# Patient Record
Sex: Female | Born: 1950 | Race: Asian | Hispanic: Yes | Marital: Married | Smoking: Former smoker
Health system: Southern US, Community
[De-identification: ages and names within clinical notes are randomized; demographics above are authoritative.]

## PROBLEM LIST (undated history)

## (undated) DIAGNOSIS — I272 Pulmonary hypertension, unspecified: Secondary | ICD-10-CM

## (undated) DIAGNOSIS — E1122 Type 2 diabetes mellitus with diabetic chronic kidney disease: Secondary | ICD-10-CM

## (undated) DIAGNOSIS — G4730 Sleep apnea, unspecified: Secondary | ICD-10-CM

## (undated) DIAGNOSIS — H43811 Vitreous degeneration, right eye: Secondary | ICD-10-CM

## (undated) DIAGNOSIS — E7800 Pure hypercholesterolemia, unspecified: Secondary | ICD-10-CM

## (undated) DIAGNOSIS — I10 Essential (primary) hypertension: Secondary | ICD-10-CM

## (undated) DIAGNOSIS — Z8719 Personal history of other diseases of the digestive system: Secondary | ICD-10-CM

## (undated) DIAGNOSIS — D649 Anemia, unspecified: Secondary | ICD-10-CM

## (undated) DIAGNOSIS — R011 Cardiac murmur, unspecified: Secondary | ICD-10-CM

## (undated) DIAGNOSIS — H04123 Dry eye syndrome of bilateral lacrimal glands: Secondary | ICD-10-CM

## (undated) DIAGNOSIS — E785 Hyperlipidemia, unspecified: Secondary | ICD-10-CM

## (undated) DIAGNOSIS — I480 Paroxysmal atrial fibrillation: Secondary | ICD-10-CM

## (undated) DIAGNOSIS — E039 Hypothyroidism, unspecified: Secondary | ICD-10-CM

## (undated) DIAGNOSIS — K219 Gastro-esophageal reflux disease without esophagitis: Secondary | ICD-10-CM

## (undated) HISTORY — DX: Essential (primary) hypertension: I10

## (undated) HISTORY — DX: Gastro-esophageal reflux disease without esophagitis: K21.9

## (undated) HISTORY — DX: Vitreous degeneration, right eye: H43.811

## (undated) HISTORY — DX: Pure hypercholesterolemia, unspecified: E78.00

## (undated) HISTORY — PX: BREAST REDUCTION SURGERY: SHX8

## (undated) HISTORY — PX: TONSILLECTOMY: SUR1361

## (undated) HISTORY — PX: EYE SURGERY: SHX253

## (undated) HISTORY — DX: Personal history of other diseases of the digestive system: Z87.19

## (undated) HISTORY — DX: Hypothyroidism, unspecified: E03.9

## (undated) HISTORY — DX: Sleep apnea, unspecified: G47.30

## (undated) HISTORY — DX: Anemia, unspecified: D64.9

## (undated) HISTORY — DX: Cardiac murmur, unspecified: R01.1

## (undated) HISTORY — DX: Paroxysmal atrial fibrillation: I48.0

## (undated) HISTORY — DX: Dry eye syndrome of bilateral lacrimal glands: H04.123

## (undated) HISTORY — DX: Hyperlipidemia, unspecified: E78.5

## (undated) HISTORY — PX: CHOLECYSTECTOMY: SHX55

## (undated) HISTORY — DX: Pulmonary hypertension, unspecified: I27.20

## (undated) HISTORY — DX: Type 2 diabetes mellitus with diabetic chronic kidney disease: E11.22

---

## 2009-08-26 HISTORY — PX: NEPHRECTOMY: SHX65

## 2015-08-27 HISTORY — PX: CARDIAC CATHETERIZATION: SHX172

## 2017-12-16 ENCOUNTER — Ambulatory Visit: Payer: Medicare Other | Admitting: Internal Medicine

## 2017-12-16 ENCOUNTER — Encounter: Payer: Self-pay | Admitting: Internal Medicine

## 2017-12-16 ENCOUNTER — Ambulatory Visit (INDEPENDENT_AMBULATORY_CARE_PROVIDER_SITE_OTHER)
Admission: RE | Admit: 2017-12-16 | Discharge: 2017-12-16 | Disposition: A | Discharge status: Home / Self Care | Payer: Medicare Other | Source: Ambulatory Visit | Attending: Internal Medicine | Admitting: Internal Medicine

## 2017-12-16 VITALS — BP 120/70 | HR 74 | Ht 61.0 in | Wt 169.6 lb

## 2017-12-16 DIAGNOSIS — R0609 Other forms of dyspnea: Secondary | ICD-10-CM

## 2017-12-16 MED ORDER — PREDNISONE 10 MG PO TABS: ORAL_TABLET | ORAL | 0 refills | Status: DC

## 2017-12-16 MED ORDER — BUDESONIDE-FORMOTEROL FUMARATE 160-4.5 MCG/ACT IN AERO: 2.0000 | INHALATION_SPRAY | Freq: Two times a day (BID) | RESPIRATORY_TRACT | 0 refills | Status: DC

## 2017-12-16 NOTE — Assessment & Plan Note (Addendum)
12/16/2017  Walked RA x 3 laps @ 185 ft each stopped due to  End of study, nl pace, no   desat  At nl pace, some sob  - Spirometry 12/16/2017  FEV1 1.30 (73%)  Ratio 78 min curvature p ? With very poor hfa - 12/16/2017  After extensive coaching inhaler device  effectiveness =    75% from a baseline of 25%    Symptoms are markedly disproportionate to objective findings and not clear to what extent this is actually a pulmonary  problem but pt does appear to have difficult to sort out respiratory symptoms of unknown origin for which  DDX  = almost all start with A and  include Adherence, Ace Inhibitors, Acid Reflux, Active Sinus Disease, Alpha 1 Antitripsin deficiency, Anxiety masquerading as Airways dz,  ABPA,  Allergy(esp in young), Aspiration (esp in elderly), Adverse effects of meds,  Active smokers, A bunch of PE's/clot burden (a few small clots can't cause this syndrome unless there is already severe underlying pulm or vascular dz with poor reserve),  Anemia or thyroid disorder, plus two Bs  = Bronchiectasis and Beta blocker use..and one C= CHF     Adherence is always the initial "prime suspect" and is a multilayered concern that requires a "trust but verify" approach in every patient - starting with knowing how to use medications, especially inhalers, correctly, keeping up with refills and understanding the fundamental difference between maintenance and prns vs those medications only taken for a very short course and then stopped and not refilled.  - see hfa teaching - return with all meds in hand using a trust but verify approach to confirm accurate Medication  Reconciliation The principal here is that until we are certain that the  patients are doing what we've asked, it makes no sense to ask them to do more.    ? Asthma/ allergy > suggested by resp to prednisone reported > repeat pred and start symb 160 2bid x 2 weeks trial then return to regroup   ? Adverse drug affect > off amiodarone x 2 weeks s  evidence of ILD   ? Anxiety/ depression/ deconditioning > dx of exclusion   ? Anemia /thyroid dz > has high tsh per care everywhere and this might contribute to fatigue but not variable doe   ? BB effects > unlikely at such low levels  ? CHF > def cardiomegaly s/p avr > last echo 02/2017 not accessible in care everywhere.> need bnp on return    Total time devoted to counseling  > 50 % of initial 60 min office visit:  review case with pt/ discussion of options/alternatives/ personally creating written customized instructions  in presence of pt  then going over those specific  Instructions directly with the pt including how to use all of the meds but in particular covering each new medication in detail and the difference between the maintenance= "automatic" meds and the prns using an action plan format for the latter (If this problem/symptom => do that organization reading Left to right).  Please see AVS from this visit for a full list of these instructions which I personally wrote for this pt and  are unique to this visit.

## 2017-12-16 NOTE — Progress Notes (Signed)
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   12/16/2017 1st [facility] office visit/ Amber Costa   Chief Complaint  Patient presents with  . Pulmonary Consult    Self referral. Pt c/o cough, congestion and SOB for the past year.  Cough is non prod. She gets SOB walking less than 30 min and also with walking up stairs. She states that when she walks she breaks out in a rash on her face and chest.   variable doe since AVR in 2017 off amiodarone x 2 weeks prior to OV  No better  Prednisone helped a lot/ inhalers helped but worried about her heart and not consistent with them and doesn't know their names nor is she able to use hfa (see a/p)  Cough is  Dry and more day than noct  Doe = MMRC1 = can walk nl pace, flat grade, can't hurry or go uphills or steps s sob     No obviouspatterms day to day or daytime variability or assoc excess/ purulent sputum or mucus plugs or hemoptysis or cp or chest tightness, subjective wheeze or overt sinus or hb symptoms. No unusual exposure hx or h/o childhood pna/ asthma or knowledge of premature birth.  Sleepings on 2 big pillows   without nocturnal  or early am exacerbation  of respiratory  c/o's or need for noct saba. Also denies any obvious fluctuation of symptoms with weather or environmental changes or other aggravating or alleviating factors except as outlined above   Current Allergies, Complete Past Medical History, Past Surgical History, Family History, and  Social History were reviewed in [facility] electronic medical record.  ROS  The following are not active complaints unless bolded Hoarseness, sore throat, dysphagia, dental problems, itching, sneezing,  nasal congestion or discharge of excess mucus or purulent secretions, ear ache,   fever, chills, sweats, unintended wt loss or wt gain, classically pleuritic or exertional cp,  orthopnea pnd or arm/hand swelling  or leg swelling, presyncope, palpitations, abdominal pain, anorexia, nausea, vomiting, diarrhea  or change in bowel habits or change in bladder habits, change in stools or change in urine, dysuria, hematuria,  rash, arthralgias, visual complaints, headache, numbness, weakness or ataxia or problems with walking or coordination,  change in mood or  memory.        Current Meds  Medication Sig  . albuterol (PROVENTIL HFA) 108 (90 Base) MCG/ACT inhaler Inhale 2 puffs into the lungs every 4 (four) hours as needed.  Marland Kitchen. aspirin 81 MG chewable tablet Chew 1 tablet by mouth daily.  Marland Kitchen. b complex vitamins tablet Take 1 tablet by mouth daily.  . cholecalciferol (VITAMIN D) 1000 units tablet Take 1,000 Units by mouth daily.  Marland Kitchen. levothyroxine (SYNTHROID, LEVOTHROID) 112 MCG tablet Take 112 mcg by mouth daily before breakfast.  . losartan (COZAAR) 25 MG tablet Take 1 tablet by mouth daily.  . metoprolol tartrate (LOPRESSOR) 25 MG  tablet Take 0.5 tablets by mouth 2 (two) times daily.  . Multiple Vitamins-Minerals (HAIR SKIN NAILS PO) Take by mouth daily.         Review of Systems     Objective:   Physical Exam amb elderly latina nad   Wt Readings from Last 3 Encounters:  12/16/17 169 lb 9.6 oz (76.9 kg)     Vital signs reviewed - Note on arrival 02 sats  100% on RA        HEENT: nl dentition, turbinates bilaterally, and oropharynx. Nl external ear canals without cough reflex   NECK :  without JVD/Nodes/TM/ nl carotid upstrokes bilaterally   LUNGS: no acc muscle use,  Nl contour  chest with late exp wheeze bilaterally with  cough on  exp maneuvers   CV:  RRR  no s3 or murmur or increase in P2, and no edema   ABD:  soft and nontender with nl inspiratory excursion in the supine position. No bruits or organomegaly appreciated, bowel sounds nl  MS:  Nl gait/ ext warm without deformities, calf tenderness, cyanosis or clubbing No obvious joint restrictions   SKIN: warm and dry without lesions    NEURO:  alert, approp, nl sensorium with  no motor or cerebellar deficits apparent.     CXR PA and Lateral:   12/16/2017 :    I personally reviewed images and   impression as follows:   CM s chf/ moderate thoracic kyphosis          Assessment:

## 2017-12-16 NOTE — Patient Instructions (Addendum)
Try symbicort 160 Take 2 puffs first thing in am and then another 2 puffs about 12 hours later  Work on inhaler technique:  relax and gently blow all the way out then take a nice smooth deep breath back in, triggering the inhaler at same time you start breathing in.  Hold for up to 5 seconds if you can. Blow out thru nose. Rinse and gargle with water when done      Prednisone 10 mg take  4 each am x 2 days,   2 each am x 2 days,  1 each am x 2 days and stop    Please remember to go to the  x-ray department downstairs in the basement  for your tests - we will call you with the results when they are available.      Please schedule a follow up office visit in 2  weeks, sooner if needed  with all medications /inhalers/ solutions in hand so we can verify exactly what you are taking. This includes all medications from all doctors and over the counters

## 2017-12-17 ENCOUNTER — Encounter: Payer: Self-pay | Admitting: Internal Medicine

## 2017-12-17 ENCOUNTER — Telehealth: Payer: Self-pay | Admitting: Internal Medicine

## 2017-12-17 NOTE — Progress Notes (Signed)
LMTCB

## 2017-12-17 NOTE — Telephone Encounter (Signed)
Notes recorded by Nyoka CowdenWert, Michael B, MD on 12/17/2017 at 11:11 AM EDT Call pt: Reviewed cxr and no acute change so [facility]  Advised pt of results. Pt understood and nothing further is needed.

## 2018-01-02 ENCOUNTER — Encounter: Payer: Self-pay | Admitting: Internal Medicine

## 2018-01-02 ENCOUNTER — Ambulatory Visit: Payer: Medicare Other | Admitting: Internal Medicine

## 2018-01-02 VITALS — BP 128/80 | HR 92 | Ht 61.0 in | Wt 168.8 lb

## 2018-01-02 DIAGNOSIS — R0609 Other forms of dyspnea: Secondary | ICD-10-CM | POA: Diagnosis not present

## 2018-01-02 NOTE — Patient Instructions (Signed)
Symbicort 160 can be used up to 2 puffs every 12 hours if any trouble with breathing/ coughing/ wheezing/ chest tight but no need to use if doing well   Only use your albuterol as a rescue medication to be used if you can't catch your breath by resting or doing a relaxed purse lip breathing pattern.  - The less you use it, the better it will work when you need it. - Ok to use up to 2 puffs  every 4 hours if you must but call for immediate appointment if use goes up over your usual need - Don't leave home without it !!  (think of it like the spare tire for your car)     Let your primary doctor decide about whether you need to return to see [place] but if you do need to return, bring back all your active medications with you

## 2018-01-02 NOTE — Progress Notes (Addendum)
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    History of Present Illness  12/16/2017 1st [facility] office [facility]/ Amber Costa   Chief Complaint  Patient presents with  . Pulmonary Consult    Self referral. Pt c/o cough, congestion and SOB for the past year.  Cough is non prod. She gets SOB walking less than 30 min and also with walking up stairs. She states that when she walks she breaks out in a rash on her face and chest.   variable doe since AVR in 2017 off amiodarone x 2 weeks prior to OV  No better  Prednisone helped a lot/ inhalers helped but worried about her heart and not consistent with them and doesn't know their names nor is she able to use hfa (see a/p)  Cough is  Dry and more day than noct  Doe = MMRC1 = can walk nl pace, flat grade, can't hurry or go uphills or steps s sob   rec Try symbicort 160 Take 2 puffs first thing in am and then another 2 puffs about 12 hours later Work on inhaler technique:   Prednisone 10 mg take  4 each am x 2 days,   2 each am x 2 days,  1 each am x 2 days and stop    01/02/2018  f/u ov/Amber Costa re: unexplained sob s/p amiodarone rx around 11/30/17 - did not bring back meds as req  Chief Complaint  Patient presents with  . Follow-up    Breathing has improved some. She c/o "allergies"- cough with clear sputum.  She has not had to use her albuterol inhaler. She states she has a rash on her face that comes and goes- with exercise and  stress.   Dyspnea: improved  MMRC1 = can walk nl pace, flat grade, can't hurry or go uphills or steps s sob   Cough: minimal daytime/ not noct or early / mucoid Did not really feel prednisone made that much difference with breathing or impact on itchy rash which comes and goes s pattern chronically ? Hives    SABA use:  None since last ov   No obvious day to day or daytime variability or assoc excess/ purulent sputum or mucus plugs or hemoptysis or cp or chest tightness, subjective wheeze or overt sinus or hb symptoms. No unusual exposure hx or h/o childhood pna/ asthma or knowledge of premature birth.  Sleeping  With min head elevation   without nocturnal  or early am exacerbation  of respiratory  c/o's or need for noct saba. Also denies any obvious fluctuation of symptoms with weather or environmental changes or other aggravating or alleviating factors except as outlined above   Current Allergies, Complete Past Medical History, Past Surgical History, Family History, and Social History were reviewed in [facility] record.  ROS  The following are not active complaints unless bolded  Hoarseness, sore throat, dysphagia, dental problems, itching, sneezing,  nasal congestion or discharge of excess mucus or purulent secretions, ear ache,   fever, chills, sweats, unintended wt loss or wt gain, classically pleuritic or exertional cp,  orthopnea pnd or arm/hand swelling  or leg swelling, presyncope, palpitations, abdominal pain, anorexia, nausea, vomiting, diarrhea  or change in bowel habits or change in bladder habits, change in stools or change in urine, dysuria, hematuria,  Rash chronic recurrent , arthralgias, visual complaints, headache, numbness, weakness or ataxia or problems with walking or coordination,  change in mood or  memory.        Current Meds  Medication Sig  . albuterol (PROVENTIL HFA) 108 (90 Base) MCG/ACT inhaler Inhale 2 puffs into the lungs every 4 (four) hours  as needed.  Marland Kitchen aspirin 81 MG chewable tablet Chew 1 tablet by mouth daily.  . budesonide-formoterol (SYMBICORT) 160-4.5 MCG/ACT inhaler Inhale 2 puffs into the lungs 2 (two) times daily.  . cholecalciferol (VITAMIN D) 1000 units tablet Take 1,000 Units by mouth daily.  Marland Kitchen levothyroxine (SYNTHROID, LEVOTHROID) 112 MCG tablet Take 112 mcg by mouth daily before breakfast.  . losartan (COZAAR) 25 MG tablet Take 1 tablet by mouth daily.  . metoprolol tartrate (LOPRESSOR) 25 MG tablet Take 0.5 tablets by mouth 2 (two) times daily.  . Multiple Vitamins-Minerals (HAIR SKIN NAILS PO) Take by mouth daily.                 Objective:   Physical Exam   amb latina wf nad   01/02/2018        168    12/16/17 169 lb 9.6 oz (76.9 kg)      Vital signs reviewed - Note on arrival 02 sats  98% on RA    HEENT: nl dentition, turbinates bilaterally, and oropharynx. Nl external ear canals without cough reflex   NECK :  without JVD/Nodes/TM/ nl carotid upstrokes bilaterally   LUNGS: no acc muscle use,  Nl contour chest which is clear to A and P bilaterally without cough on insp or exp maneuvers   CV:  RRR  no s3 or murmur or increase in P2, and no edema   ABD:  soft and nontender with nl inspiratory excursion in the supine position. No bruits or organomegaly appreciated, bowel sounds nl  MS:  Nl gait/ ext warm without deformities, calf tenderness, cyanosis or clubbing No obvious joint restrictions   SKIN: warm and dry without lesions    NEURO:  alert, approp, nl sensorium with  no motor or cerebellar deficits apparent.                 Assessment:

## 2018-01-04 ENCOUNTER — Encounter: Payer: Self-pay | Admitting: Internal Medicine

## 2018-01-04 NOTE — Assessment & Plan Note (Signed)
12/16/2017  Walked RA x 3 laps @ 185 ft each stopped due to  End of study, nl pace, no   desat  At nl pace, some sob  - Spirometry 12/16/2017  FEV1 1.30 (73%)  Ratio 78 min curvature p ? With very poor hfa  - 12/16/2017   try symb 160  2bid> improved 01/02/2018 so rec f/u prn  - 01/02/2018  After extensive coaching inhaler device  effectiveness =    75% from baseline 25%  I had an extended final summary discussion with the patient reviewing all relevant studies completed to date and  lasting 15 to 20 minutes of a 25 minute visit on the following issues:    I doubt she has much asthma as she has improved with such poor hfa but she could have mild dz and ok to use symbicort "as needed" here Based on the study from NEJM  378; 20 p 1865 (2018) in pts with mild asthma it is reasonable to use low dose symbicort eg 80 2bid "prn" flare in this setting but I emphasized this was only shown with symbicort and takes advantage of the rapid onset of action but is not the same as "rescue therapy" but can be stopped once the acute symptoms have resolved and the need for rescue has been minimized (< 2 x weekly)    See device teaching which extended face to face time for this visit   Pulmonary f/u can be prn

## 2019-03-12 IMAGING — DX DG CHEST 2V
2 series · 2 of 2 positions shown · non-contrast
Comparison: None.

CLINICAL DATA: Cough and congestion with shortness of breath.

EXAM:
CHEST - 2 VIEW

[chest pa]
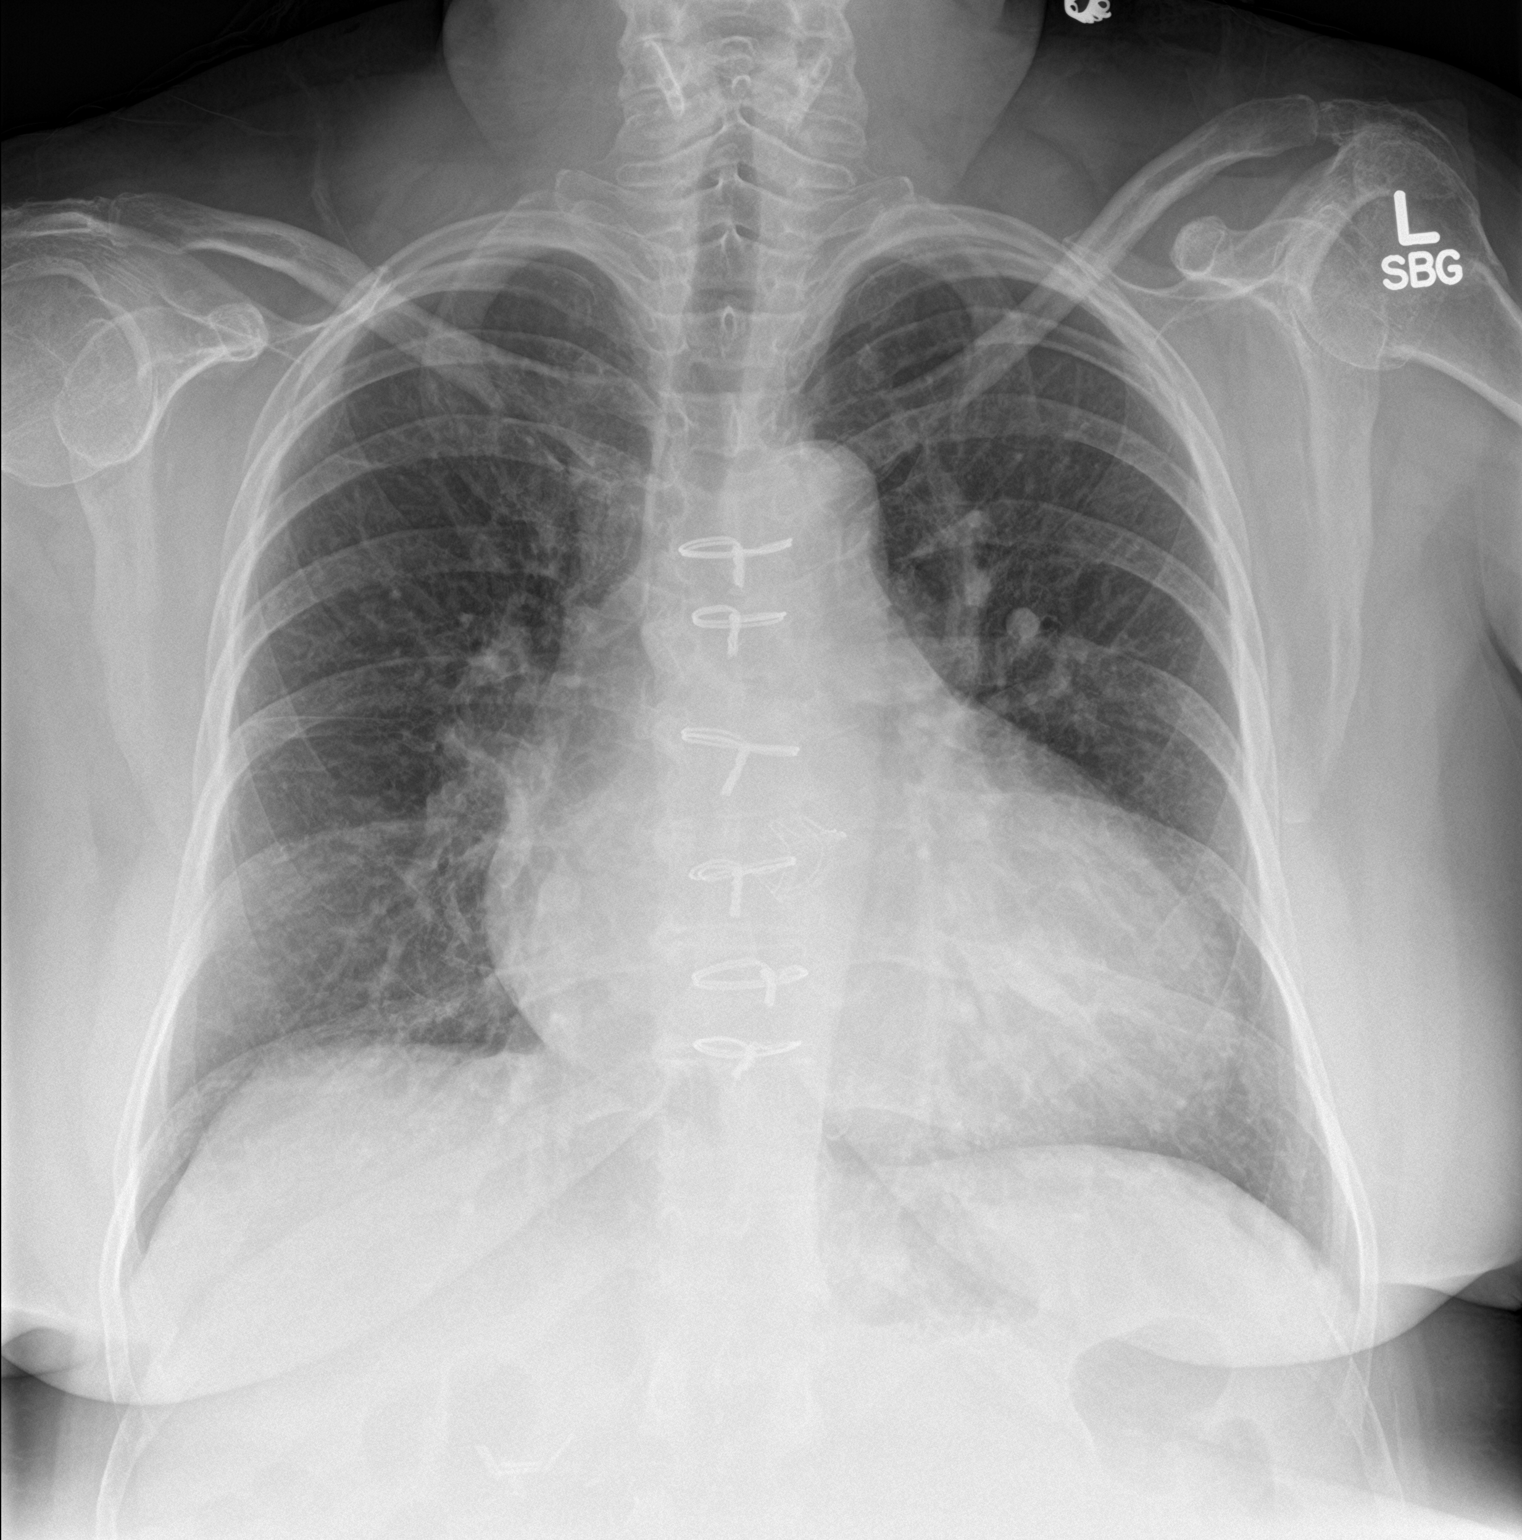

[chest lat]
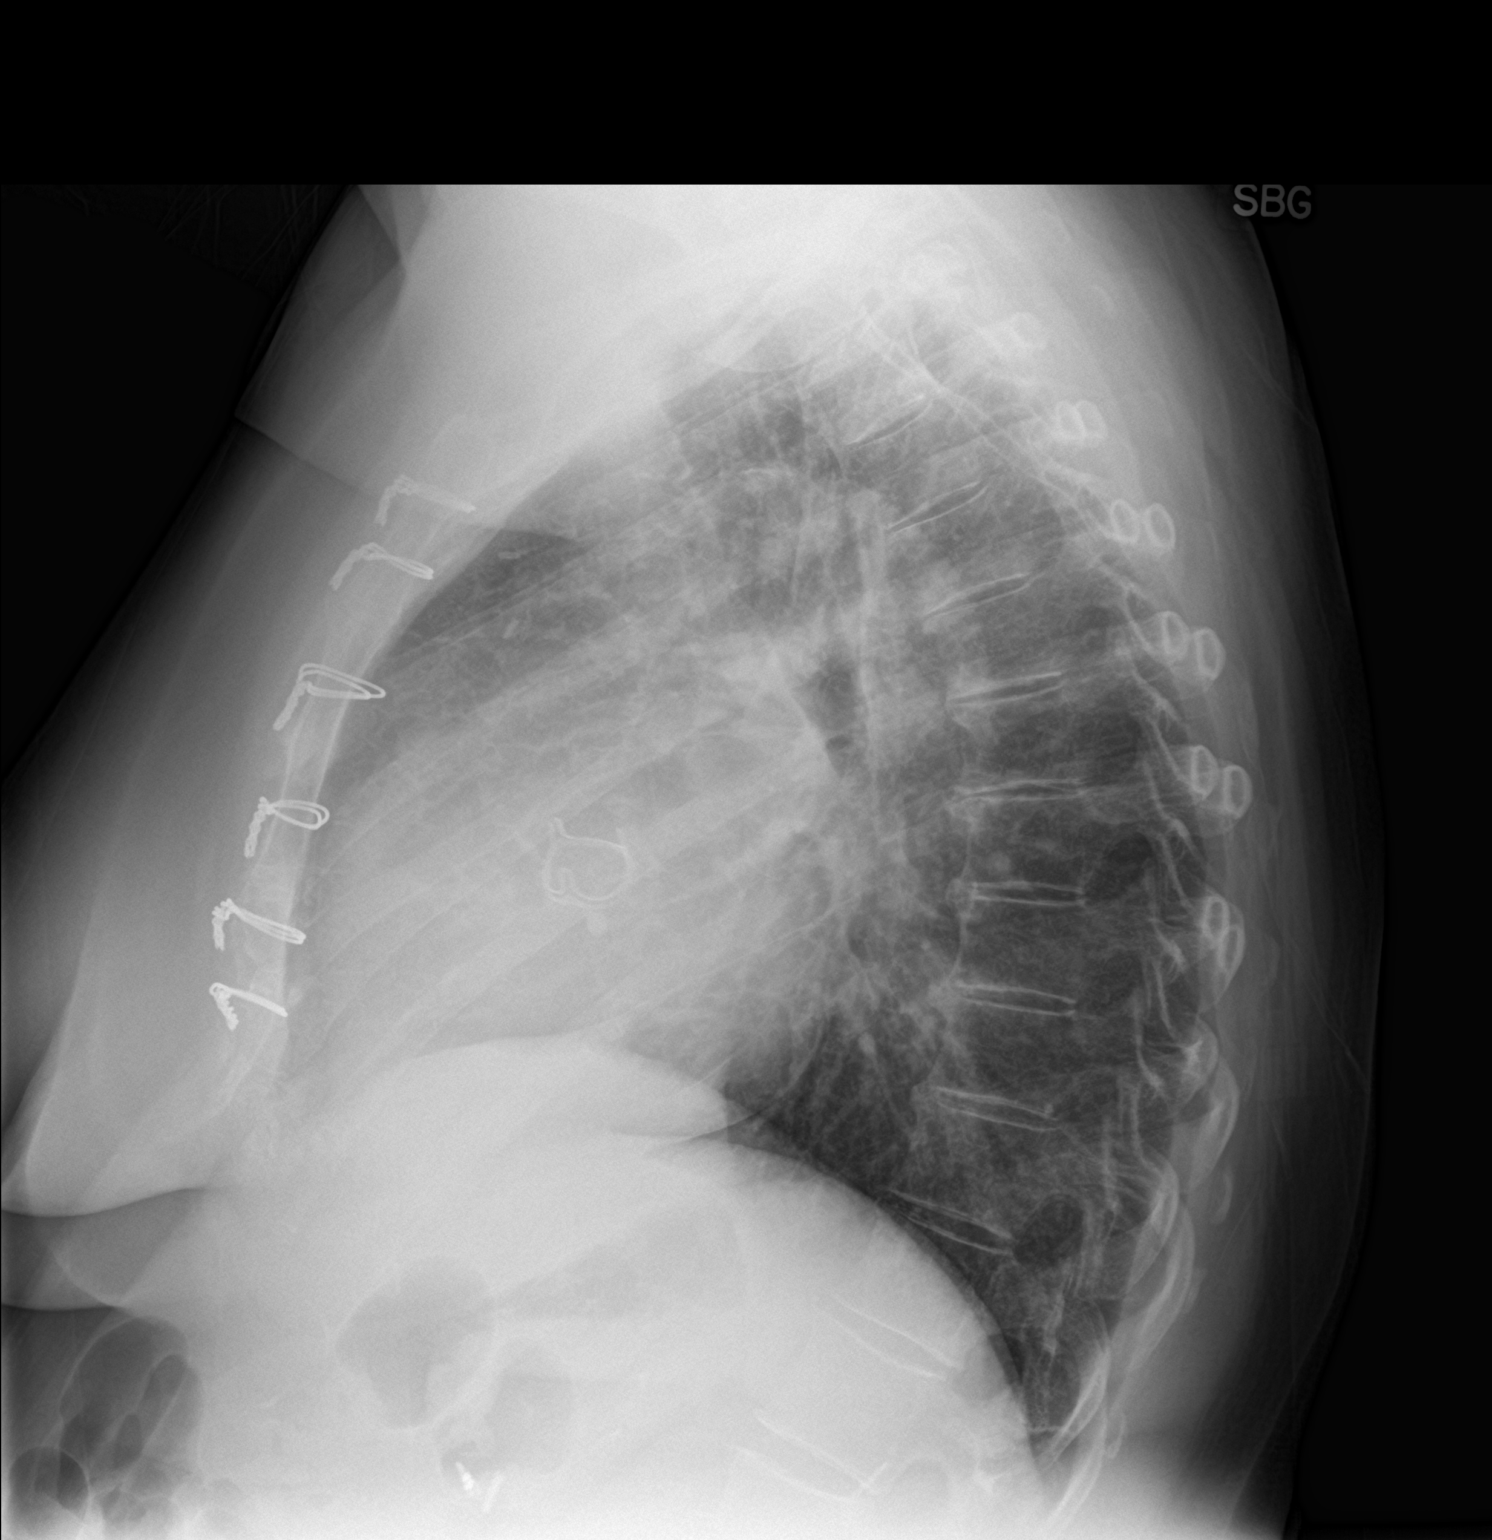

[2 of 2 positions shown; findings below may reference images not displayed]

FINDINGS: The heart is enlarged. Previous median sternotomy for AVR. Mild
vascular congestion without consolidation or frank edema. No osseous
findings.
IMPRESSION: Cardiomegaly.  No active disease.

## 2021-05-24 ENCOUNTER — Other Ambulatory Visit: Payer: Self-pay | Admitting: Obstetrics and Gynecology

## 2021-05-24 DIAGNOSIS — D500 Iron deficiency anemia secondary to blood loss (chronic): Secondary | ICD-10-CM

## 2021-05-30 ENCOUNTER — Other Ambulatory Visit: Payer: Self-pay

## 2021-05-30 ENCOUNTER — Non-Acute Institutional Stay (HOSPITAL_COMMUNITY)
Admission: RE | Admit: 2021-05-30 | Discharge: 2021-05-30 | Disposition: A | Discharge status: Home / Self Care | Payer: Medicare Other | Source: Ambulatory Visit | Attending: Internal Medicine | Admitting: Internal Medicine

## 2021-05-30 DIAGNOSIS — D500 Iron deficiency anemia secondary to blood loss (chronic): Secondary | ICD-10-CM | POA: Insufficient documentation

## 2021-05-30 MED ORDER — DIPHENHYDRAMINE HCL 25 MG PO CAPS
25.0000 mg | ORAL_CAPSULE | ORAL | Status: DC
  Administered 2021-05-30: 25 mg via ORAL
  Filled 2021-05-30: qty 1

## 2021-05-30 MED ORDER — SODIUM CHLORIDE 0.9 % IV SOLN
INTRAVENOUS | Status: DC | PRN
Start: 2021-05-30 — End: 2021-05-31
  Administered 2021-05-30: 250 mL via INTRAVENOUS

## 2021-05-30 MED ORDER — SODIUM CHLORIDE 0.9 % IV SOLN
500.0000 mg | INTRAVENOUS | Status: DC
  Administered 2021-05-30: 500 mg via INTRAVENOUS
  Filled 2021-05-30: qty 25

## 2021-05-30 MED ORDER — ACETAMINOPHEN 500 MG PO TABS
500.0000 mg | ORAL_TABLET | ORAL | Status: DC
  Administered 2021-05-30: 500 mg via ORAL
  Filled 2021-05-30: qty 1

## 2021-05-30 NOTE — Progress Notes (Signed)
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 Tolerated well with no adverse reaction. Vital signs stable. Discharge instructions given. Patient to come back in 2 weeks for second infusion. Patient alert, oriented and ambulatory at discharge.

## 2021-06-13 ENCOUNTER — Non-Acute Institutional Stay (HOSPITAL_COMMUNITY)
Admission: RE | Admit: 2021-06-13 | Discharge: 2021-06-13 | Disposition: A | Discharge status: Home / Self Care | Payer: Medicare Other | Source: Ambulatory Visit | Attending: Internal Medicine | Admitting: Internal Medicine

## 2021-06-13 ENCOUNTER — Other Ambulatory Visit: Payer: Self-pay

## 2021-06-13 DIAGNOSIS — D500 Iron deficiency anemia secondary to blood loss (chronic): Secondary | ICD-10-CM | POA: Diagnosis not present

## 2021-06-13 MED ORDER — ACETAMINOPHEN 500 MG PO TABS
500.0000 mg | ORAL_TABLET | Freq: Once | ORAL | Status: AC
  Administered 2021-06-13: 500 mg via ORAL
  Filled 2021-06-13: qty 1

## 2021-06-13 MED ORDER — SODIUM CHLORIDE 0.9 % IV SOLN
500.0000 mg | Freq: Once | INTRAVENOUS | Status: AC
  Administered 2021-06-13: 500 mg via INTRAVENOUS
  Filled 2021-06-13: qty 25

## 2021-06-13 MED ORDER — DIPHENHYDRAMINE HCL 25 MG PO CAPS
25.0000 mg | ORAL_CAPSULE | Freq: Once | ORAL | Status: AC
  Administered 2021-06-13: 25 mg via ORAL
  Filled 2021-06-13: qty 1

## 2021-06-13 MED ORDER — SODIUM CHLORIDE 0.9 % IV SOLN
INTRAVENOUS | Status: DC | PRN
  Administered 2021-06-13: 250 mL via INTRAVENOUS

## 2021-06-13 NOTE — Progress Notes (Addendum)
Patient received IV Venofer 500mg  (dose #2 of 2) as ordered by MD. Premeds given per orders. Tolerated well, vitals stable, discharge instructions given , verbalized understanding. Patient alert, oriented, and ambulatory at the time of discharge.

## 2021-08-26 HISTORY — PX: CATARACT EXTRACTION: SUR2

## 2021-08-26 HISTORY — PX: BRONCHOSCOPY: SUR163

## 2022-05-08 ENCOUNTER — Other Ambulatory Visit: Payer: Self-pay | Admitting: Obstetrics and Gynecology

## 2022-05-24 ENCOUNTER — Encounter (HOSPITAL_COMMUNITY): Payer: Self-pay | Admitting: Anesthesiology

## 2022-05-24 NOTE — Progress Notes (Signed)
Reviewed pt chart for pre-op interview for surgery scheduled @ [facility] on 06-05-2022 by dr Murrell Redden.  Noted extensive cardiac history w/ CHF/ PAF/ severe pulmonary hypertension with solitary kidney and DM2.  Reviewed chart w/ anesthesia, Dr Roanna Banning [facility].  Per Dr Roanna Banning pt would be best done at main OR and would need cardiac clearance. Called and left message for [place], [place] scheduler for Dr Murrell Redden.  Informed her that pt needs to be moved to main OR and would need cardiac clearance prior to surgery per Dr Roanna Banning [facility].

## 2022-06-05 ENCOUNTER — Ambulatory Visit (HOSPITAL_COMMUNITY)
Admission: RE | Admit: 2022-06-05 | Payer: Medicare Other | Source: Home / Self Care | Admitting: Obstetrics and Gynecology

## 2022-06-05 SURGERY — DILATATION & CURETTAGE/HYSTEROSCOPY WITH MYOSURE
Anesthesia: Choice

## 2022-07-24 ENCOUNTER — Encounter (HOSPITAL_COMMUNITY): Payer: Self-pay

## 2022-07-24 NOTE — Pre-Procedure Instructions (Addendum)
Surgical Instructions    Your procedure is scheduled on Thursday December 7.  Report to [facility] Main Entrance "A" at 9:00 A.M., then check in with the Admitting office.  Call this number if you have problems the morning of surgery:  [phone]  If you have any questions prior to your surgery date call [phone]: Open Monday-Friday 8am-4pm  If you experience any cold, Covid, or flu symptoms such as cough, fever, chills, shortness of breath, etc. between now and your scheduled surgery, please notify us at the above number   Patient Instructions   The night before surgery:    No food after midnight. ONLY clear liquids after midnight   The day of surgery:    You may drink clear liquids until 8:00 the morning of your surgery.   Clear liquids allowed are: Water, Non-Citrus Juices (without pulp), Carbonated Beverages, Clear Tea, Black Coffee ONLY (NO MILK, CREAM OR POWDERED CREAMER of any kind), and Gatorade          If you have questions, please contact your surgeon's office.    Take these medications the morning of surgery with A SIP OF WATER:  levothyroxine (SYNTHROID, LEVOTHROID)  metoprolol succinate (TOPROL-XL)   You may take these medications with A SIP OF WATER IF YOU NEED THEM: albuterol (PROVENTIL HFA) 108 (90 Base) MCG/ACT inhaler  hydroxypropyl methylcellulose / hypromellose (ISOPTO TEARS / GONIOVISC)  omeprazole (PRILOSEC)   Please bring all inhalers with you the day of surgery.   Follow your surgeon's instructions on when to stop Aspirin.  If no instructions were given by your surgeon then you will need to call the office to get those instructions.    As of today, STOP taking any (unless otherwise instructed by your surgeon) Aleve, Naproxen, Ibuprofen, Motrin, Advil, Goody's, BC's, all herbal medications, fish oil, and all vitamins.  WHAT DO I DO ABOUT MY DIABETES MEDICATION?  STOP taking Semaglutide tabs 24 hours prior to surgery. Last dose before 11:00 am  Tuesday December 6.  Do not take oral diabetes medicines (pills) the morning of surgery.  HOW TO MANAGE YOUR DIABETES BEFORE AND AFTER SURGERY  Why is it important to control my blood sugar before and after surgery? Improving blood sugar levels before and after surgery helps healing and can limit problems. A way of improving blood sugar control is eating a healthy diet by:  Eating less sugar and carbohydrates  Increasing activity/exercise  Talking with your doctor about reaching your blood sugar goals High blood sugars (greater than 180 mg/dL) can raise your risk of infections and slow your recovery, so you will need to focus on controlling your diabetes during the weeks before surgery. Make sure that the doctor who takes care of your diabetes knows about your planned surgery including the date and location.  How do I manage my blood sugar before surgery? Check your blood sugar at least 4 times a day, starting 2 days before surgery, to make sure that the level is not too high or low.  Check your blood sugar the morning of your surgery when you wake up and every 2 hours until you get to the Short Stay unit.  If your blood sugar is less than 70 mg/dL, you will need to treat for low blood sugar: Do not take insulin. Treat a low blood sugar (less than 70 mg/dL) with  cup of clear juice (cranberry or apple), 4 glucose tablets, OR glucose gel. Recheck blood sugar in 15 minutes after treatment (to make  sure it is greater than 70 mg/dL). If your blood sugar is not greater than 70 mg/dL on recheck, call [phone] for further instructions. Report your blood sugar to the short stay nurse when you get to Short Stay.  If you are admitted to the hospital after surgery: Your blood sugar will be checked by the staff and you will probably be given insulin after surgery (instead of oral diabetes medicines) to make sure you have good blood sugar levels. The goal for blood sugar control after surgery is  80-180 mg/dL.          Do NOT Smoke (Tobacco/Vaping)  24 hours prior to your procedure  If you use a CPAP at night, you may bring your mask for your overnight stay.   Contacts, glasses, hearing aids, dentures or partials may not be worn into surgery, please bring cases for these belongings   For patients admitted to the hospital, discharge time will be determined by your treatment team.   Patients discharged the day of surgery will not be allowed to drive home, and someone needs to stay with them for 24 hours.   SURGICAL WAITING ROOM VISITATION Patients having surgery or a procedure may have no more than 2 support people in the waiting area - these visitors may rotate.   Children under the age of 63 must have an adult with them who is not the patient. If the patient needs to stay at the hospital during part of their recovery, the visitor guidelines for inpatient rooms apply. Pre-op nurse will coordinate an appropriate time for 1 support person to accompany patient in pre-op.  This support person may not rotate.   Please refer to the [facility] website for the visitor guidelines for Inpatients (after your surgery is over and you are in a regular room).    Special instructions:    Oral Hygiene is also important to reduce your risk of infection.  Remember -  BRUSH YOUR TEETH THE MORNING OF SURGERY WITH YOUR REGULAR TOOTHPASTE   - Preparing For Surgery  Before surgery, you can play an important role. Because skin is not sterile, your skin needs to be as free of germs as possible. You can reduce the number of germs on your skin by washing with CHG (chlorahexidine gluconate) Soap before surgery.  CHG is an antiseptic cleaner which kills germs and bonds with the skin to continue killing germs even after washing.     Please do not use if you have an allergy to CHG or antibacterial soaps. If your skin becomes reddened/irritated stop using the CHG.  Do not shave (including legs  and underarms) for at least 48 hours prior to first CHG shower. It is OK to shave your face.  Please follow these instructions carefully.    Shower the NIGHT BEFORE SURGERY and the MORNING OF SURGERY with CHG Soap.  If you chose to wash your hair, wash your hair first as usual with your normal shampoo.  After you shampoo, rinse your hair and body thoroughly to remove the shampoo.   Then [employer] and genitals (private parts) with your normal soap and rinse thoroughly to remove soap.  After that Use CHG Soap as you would any other liquid soap.  You can apply CHG directly to the skin and wash gently with a scrungie or a clean washcloth.   Apply the CHG Soap to your body ONLY FROM THE NECK DOWN.   Do not use on open wounds or open sores.  Avoid contact with your eyes, ears, mouth and genitals (private parts).  Wash thoroughly, paying special attention to the area where your surgery will be performed.  Thoroughly rinse your body with warm water from the neck down.  DO NOT shower/wash with your normal soap after using and rinsing off the CHG Soap.  Pat yourself dry with a CLEAN TOWEL.  Wear CLEAN PAJAMAS to bed the night before surgery  Place CLEAN SHEETS on your bed the night before your surgery  DO NOT SLEEP WITH PETS.   Day of Surgery:  Take a shower with CHG soap. Wear Clean/Comfortable clothing the morning of surgery Brush your teeth WITH YOUR REGULAR TOOTHPASTE. Do not wear jewelry or makeup. Do not wear lotions, powders, perfumes or deodorant. Do not shave 48 hours prior to surgery.   Do not bring valuables to the hospital.  [facility] is not responsible for any belongings or valuables.  Do not wear nail polish, gel polish, artificial nails, or any other type of covering on natural nails (fingers and toes) If you have artificial nails or gel coating that need to be removed by a nail salon, please have this removed prior to surgery. Artificial nails or gel coating may  interfere with anesthesia's ability to adequately monitor your vital signs.    If you received a COVID test during your pre-op visit, it is requested that you wear a mask when out in public, stay away from anyone that may not be feeling well, and notify your surgeon if you develop symptoms. If you have been in contact with anyone that has tested positive in the last 10 days, please notify your surgeon.    Please read over the following fact sheets that you were given.

## 2022-07-25 ENCOUNTER — Encounter (HOSPITAL_COMMUNITY): Payer: Self-pay

## 2022-07-25 ENCOUNTER — Other Ambulatory Visit: Payer: Self-pay

## 2022-07-25 ENCOUNTER — Encounter (HOSPITAL_COMMUNITY)
Admission: RE | Admit: 2022-07-25 | Discharge: 2022-07-25 | Disposition: A | Discharge status: Home / Self Care | Payer: Medicare Other | Source: Ambulatory Visit | Attending: Obstetrics and Gynecology | Admitting: Obstetrics and Gynecology

## 2022-07-25 VITALS — BP 122/79 | HR 85 | Temp 98.0°F | Resp 17 | Ht 61.0 in | Wt 175.0 lb

## 2022-07-25 DIAGNOSIS — I110 Hypertensive heart disease with heart failure: Secondary | ICD-10-CM | POA: Insufficient documentation

## 2022-07-25 DIAGNOSIS — I272 Pulmonary hypertension, unspecified: Secondary | ICD-10-CM | POA: Insufficient documentation

## 2022-07-25 DIAGNOSIS — N840 Polyp of corpus uteri: Secondary | ICD-10-CM | POA: Insufficient documentation

## 2022-07-25 DIAGNOSIS — Z9049 Acquired absence of other specified parts of digestive tract: Secondary | ICD-10-CM | POA: Diagnosis not present

## 2022-07-25 DIAGNOSIS — I480 Paroxysmal atrial fibrillation: Secondary | ICD-10-CM | POA: Diagnosis not present

## 2022-07-25 DIAGNOSIS — E119 Type 2 diabetes mellitus without complications: Secondary | ICD-10-CM | POA: Insufficient documentation

## 2022-07-25 DIAGNOSIS — Z87891 Personal history of nicotine dependence: Secondary | ICD-10-CM | POA: Insufficient documentation

## 2022-07-25 DIAGNOSIS — E039 Hypothyroidism, unspecified: Secondary | ICD-10-CM | POA: Diagnosis not present

## 2022-07-25 DIAGNOSIS — I129 Hypertensive chronic kidney disease with stage 1 through stage 4 chronic kidney disease, or unspecified chronic kidney disease: Secondary | ICD-10-CM | POA: Diagnosis not present

## 2022-07-25 DIAGNOSIS — Z905 Acquired absence of kidney: Secondary | ICD-10-CM | POA: Diagnosis not present

## 2022-07-25 DIAGNOSIS — G4733 Obstructive sleep apnea (adult) (pediatric): Secondary | ICD-10-CM | POA: Diagnosis not present

## 2022-07-25 DIAGNOSIS — K219 Gastro-esophageal reflux disease without esophagitis: Secondary | ICD-10-CM | POA: Diagnosis not present

## 2022-07-25 DIAGNOSIS — Z01818 Encounter for other preprocedural examination: Secondary | ICD-10-CM | POA: Diagnosis present

## 2022-07-25 DIAGNOSIS — Z952 Presence of prosthetic heart valve: Secondary | ICD-10-CM | POA: Insufficient documentation

## 2022-07-25 LAB — GLUCOSE, CAPILLARY: Glucose-Capillary: 122 mg/dL — ABNORMAL HIGH (ref 70–99)

## 2022-07-25 NOTE — Progress Notes (Addendum)
PCP - Doreatha Martin Cardiologist - Reuel Boom  PPM/ICD - denies Device Orders -  Rep Notified -   Chest x-ray -  EKG - 07/08/22- requested Stress Test - 07/02/22 - requested ECHO - 03/12/17- requested Cardiac Cath - 04/05/16- requested  Sleep Study - yes + OSA CPAP - yes  Fasting Blood Sugar - 140-160 Checks Blood Sugar 1 times a day  STOP taking Semaglutide tabs 24 hours prior to surgery. Last dose before 11:00 am Tuesday December 6.   Follow your surgeon's instructions on when to stop Aspirin.  If no instructions were given by your surgeon then you will need to call the office to get those instructions.   **Patient states last dose 12/1**   As of today, STOP taking any (unless otherwise instructed by your surgeon) Aleve, Naproxen, Ibuprofen, Motrin, Advil, Goody's, BC's, all herbal medications, fish oil, and all vitamins.  ERAS Protcol - yes PRE-SURGERY Ensure or G2- no  COVID TEST- n/a  Anesthesia review: yes, cardiac history and recent bronch- requested cardiac clearance from [facility]  Patient denies shortness of breath, fever, cough and chest pain at PAT appointment  All instructions explained to the patient, with a verbal understanding of the material. Patient agrees to go over the instructions while at home for a better understanding. The opportunity to ask questions was provided.

## 2022-07-26 ENCOUNTER — Other Ambulatory Visit: Payer: Self-pay | Admitting: Obstetrics and Gynecology

## 2022-07-26 NOTE — Anesthesia Preprocedure Evaluation (Signed)
Anesthesia Evaluation  Patient identified by MRN, date of birth, ID band Patient awake    Reviewed: Allergy & Precautions, H&P , NPO status , Patient's Chart, lab work & pertinent test results  Airway Mallampati: II   Neck ROM: full    Dental   Pulmonary sleep apnea , former smoker   breath sounds clear to auscultation       Cardiovascular hypertension, + dysrhythmias Atrial Fibrillation  Rhythm:regular Rate:Normal  Echo 07/17/22 ([facility]): - The left ventricular size is normal. Mild left ventricular hypertrophy. Left ventricular systolic function is normal. LV ejection fraction = 65-70%.  - The right ventricle is normal in size and function. Right ventricular pressure overload pattern  - The left atrium is moderately to severely dilated.  - There is a bioprosthetic aortic valve. There is no aortic regurgitation. There is a bioprosthetic aortic valve with mild aortic stenosis, with mean PG of 4.6, and Vmax of 2.04 m/s.  Diffuse thickening of the mitral leaftlet with restricted leaflet opening.  - There is moderate to severe mitral annular calcification. There is moderate mitral regurgitation. The mitral regurgitant jet is eccentrically directed. The mean gradient across the mitral valve is 4.6 mmHg. The heart rate for the mean mitral valve gradient is 83 BPM.  - There is severe tricuspid regurgitation. The tricuspid regurgitant jet is eccentrically directed. Severe pulmonary hypertension. Estimated right ventricular systolic pressure is 86.7 mmHg.  - The pulmonic valve is not well visualized. Mild pulmonic valvular  regurgitation.  - The IVC was mildly dilated.  - There is no pericardialeffusion.   Nuclear stress test 07/02/22: 1.  Pharmacologic stress nuclear study is normal. 2.  The gated study showed the ejection fraction was greater than 75%. 3.  There is no obvious ischemia on the study.  PVCs noted during the study.    Neuro/Psych    GI/Hepatic ,GERD  ,,  Endo/Other  diabetesHypothyroidism    Renal/GU      Musculoskeletal   Abdominal   Peds  Hematology   Anesthesia Other Findings   Reproductive/Obstetrics                             Anesthesia Physical Anesthesia Plan  ASA: 3  Anesthesia Plan: General   Post-op Pain Management:    Induction: Intravenous  PONV Risk Score and Plan: 3 and Ondansetron, Dexamethasone and Treatment may vary due to age or medical condition  Airway Management Planned: LMA  Additional Equipment:   Intra-op Plan:   Post-operative Plan: Extubation in OR  Informed Consent: I have reviewed the patients History and Physical, chart, labs and discussed the procedure including the risks, benefits and alternatives for the proposed anesthesia with the patient or authorized representative who has indicated his/her understanding and acceptance.     Dental advisory given  Plan Discussed with: CRNA, Anesthesiologist and Surgeon  Anesthesia Plan Comments: (See PAT note written 07/26/2022 by Shonna Chock, PA-C.  )       Anesthesia Quick Evaluation

## 2022-07-26 NOTE — Progress Notes (Signed)
Anesthesia Chart Review:  Case: [id number] Date/Time: 08/01/22 1044   Procedure: DILATATION & CURETTAGE/HYSTEROSCOPY WITH MYOSURE - 45 min.   Anesthesia type: Choice   Pre-op diagnosis: Endometrial Polyp   Location: [facility] ROOM 07 / [facility] OR   Surgeons: Vick Frees, MD       DISCUSSION: Patient is a 71 year old female scheduled for the above procedure.  Surgery was initially scheduled at [facility] on 06/05/22, but due to her extensive cardiac history including CHF, PAF, severe pulmonary hypertension anesthesiologist felt case should be moved to main OR and should also have cardiology clearance. Since then she had cardiology preoperative risk assessment (see below).  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 No. [id number], repair of anterior mitral leaflet 05/07/16, Dr. Halford Decamp [facility]), murmur (07/17/22 echo: mild bioprosthetic AS, moderate MR, severe TR), PAF (post-op 2017; 2021, no longer on anticoagulation), pulmonary hypertension, anemia, hiatal hernia, GERD, renal cell carcinoma (s/p right nephrectomy 2011 in [place] cholecystectomy, LLL lung nodule (s/p bronchoscopy 07/23/22).   She had preoperative cardiology evaluation for bronchoscopy and hysteroscopy surgeries on 07/08/22 by Jodell Cipro, DNP with [facility]. She wrote, "Preop cardiac evaluation -EKG today shows normal sinus rhythm with heart rate of 85. Negative recent stress test from 07/02/2022. The patient denies any symptoms. He has a scheduled upcoming procedure like hysterectomy and bronchoscopy. She is in low to medium or risks for proposed procedure from a cardiac standpoint." Since an echo was done on 07/17/22 following the clearance visit, I contacted cardiology and confirmed that her echo had been reviewed and no new changes regarding her cardiac  clearance status.    S/p bronchoscopy 07/23/22 at [facility]. Per records in Care Everywhere: Robotic assisted bronchoscopy to LLL lesion with FNA and TBLB  Anesthesia unable to pass 8.5 or 8.0 ETT due to resistance in trachea so 7.5 ETT placed  Unable to perform EBUS staging. Awaiting pathology results.  Airway/Intubation Details: Endotracheal tube insertion site: oral  Blade: Miller  Blade size: #2  ETT size: 7.5 mm  Cuffed: yes  View (Cormack Lehane grade): grade IIa - partial view of glottis  Placement verified by: chest auscultation/breath sounds equal bilaterally and capnometry/+EtCO2  Number of attempts at approach: 3 or more  Ventilation between attempts: BVM  Airway placement trauma: none  Additional Comments: Attempt 1 unable to pass 8.5ETT, attempt 2 unable to pas   She is on oral semaglutide and advised to hold for 24 hours prior to surgery.   Anesthesia team to evaluate on the day of surgery.  Updated anesthesiologist Dr. Leilani Able.   VS: BP 122/79   Pulse 85   Temp 36.7 C   Resp 17   Ht 5\' 1"  (1.549 m)   Wt 79.4 kg   SpO2 97%   BMI 33.07 kg/m    PROVIDERS: ., MD is PCP  Cheron Schaumann, DO is cardiologist ([facility]) Tollie Pizza, MD is pulmonologist ([facility])   LABS: She had labs on 07/03/22 through [facility] including CMP, CBC, A1c, TSH. Results included: Sodium 138, potassium 4.3, glucose 117, AST 26, ALT 21, creatinine 1.28, TSH 3.07, A1c 6.7%, WBC 8.2, hemoglobin 12.9, hematocrit 39.7, platelet count 205. Labs Reviewed  GLUCOSE, CAPILLARY - Abnormal; Notable for the following components:      Result Value   Glucose-Capillary 122 (*)  All other components within normal limits     IMAGES: CT Chest 07/05/22 ([facility]): IMPRESSION:  1. 2.8 cm left lower lobe pulmonary has increased slightly in size  in the interval since 05/07/2022.  2. 4 mm nodule in the posterior right costophrenic sulcus is new in  the interval.  This may well be atelectatic. Attention on follow-up  recommended.  3. Stable clustered micro nodularity in the periphery of the right  lower lobe, likely sequelae of atypical infection.  4. Tiny hiatal hernia.  5.  Aortic Atherosclerosis (ICD10-I70.0).   PET Scan 05/31/22: IMPRESSION:  1. Left lower lobe pulmonary nodule is significantly enlarged since  03/29/2020 and demonstrates new mild hypermetabolism. Suspicious for  indolent primary bronchogenic carcinoma. Metastatic disease from  patient's renal cell carcinoma felt less likely. Given slow growth,  well-circumscribed appearance, and low-level hypermetabolism, benign  etiology such as hamartoma also possible. Consider tissue sampling.  2. Stable size of mediastinal nodes with low-level hypermetabolism,  favored to be benign/reactive.  3. Incidental findings, including: Aortic Atherosclerosis  (ICD10-I70.0). Tiny hiatal hernia. Hepatic steatosis.    EKG: 07/08/22 ([facility]): Copy on shadow chart. Sinus rhythm  Nonspecific T wave changes  When compared with ECG of 04-Apr-2022 11:18,  Nonspecific T wave abnormality now evident in anterolateral leads  Confirmed by Gabrielle Dare [phone]) on 07/09/2022 7:38:02 AM    CV: Echo 07/17/22 ([facility]): SUMMARY  - The left ventricular size is normal. Mild left ventricular hypertrophy. Left ventricular systolic function is normal. LV ejection fraction = 65-70%.  - The right ventricle is normal in size and function. Right ventricular pressure overload pattern  - The left atrium is moderately to severely dilated.  - There is a bioprosthetic aortic valve. There is no aortic regurgitation. There is a bioprosthetic aortic valve with mild aortic stenosis, with mean PG of 4.6, and Vmax of 2.04 m/s.  Diffuse thickening of the mitral leaftlet with restricted leaflet opening.  - There is moderate to severe mitral annular calcification. There is moderate mitral regurgitation. The mitral  regurgitant jet is eccentrically directed. The mean gradient across the mitral valve is 4.6 mmHg. The heart rate for the mean mitral valve gradient is 83 BPM.  - There is severe tricuspid regurgitation. The tricuspid regurgitant jet is eccentrically directed. Severe pulmonary hypertension. Estimated right ventricular systolic pressure is [id number] mmHg.  - The pulmonic valve is not well visualized. Mild pulmonic valvular  regurgitation.  - The IVC was mildly dilated.  - There is no pericardial effusion.  (Comparison echo 03/14/20: LVEF 65-70%, mildly dilated RV, mild-moderately reduced RVSF, bioprosthetic AVR with mean pressure gradient of 9 mmHg, mild-moderate MR, mild MS, moderate TR, severe pulmonary HTN, RVSP 108 mmHg.)   Nuclear stress test 07/02/22 ([facility]): Report is not viewable in [place]. Impressions: 1.  Pharmacologic stress nuclear study is normal. 2.  The gated study showed the ejection fraction was greater than 75%. 3.  There is no obvious ischemia on the study.  PVCs noted during the study.   US Carotid 04/19/16 [facility]): Summary   1. There is mild atherosclerosis seen in both carotid arteries, but only   mild internal carotid artery stenosis, 1-39% bilaterally.   2. Vertebral flow is antegrade and normal bilaterally.   3. Subclavian flow is multiphasic and normal bilaterally.    Cardiac cath (Pre-AVR) 04/05/16 [facility]): Diagnostic Procedure Summary  1. Normal coronaries  2. Normal LV function  3. Severe AS, min AI  4. Mild pulmonary HTN  Diagnostic Procedure Recommendations  CTS consult for AVR    Past Medical History:  Diagnosis Date   Anemia    Dry eye syndrome of bilateral lacrimal glands    GERD (gastroesophageal reflux disease)    Heart murmur    History of hiatal hernia    Hypercholesterolemia    Hyperlipidemia    Hypertension    Hypothyroidism    PAF (paroxysmal atrial fibrillation) (HCC)    Posterior vitreous detachment of right eye    Pulmonary  hypertension (HCC)    Sleep apnea    Type 2 diabetes mellitus with chronic kidney disease, without long-term current use of insulin (HCC)     Past Surgical History:  Procedure Laterality Date   BREAST REDUCTION SURGERY Bilateral    BRONCHOSCOPY  2023   CARDIAC CATHETERIZATION  2017   CATARACT EXTRACTION Bilateral 2023   CHOLECYSTECTOMY     EYE SURGERY     lasik   NEPHRECTOMY Right 2011   TONSILLECTOMY      MEDICATIONS:  albuterol (PROVENTIL HFA) 108 (90 Base) MCG/ACT inhaler   aspirin 81 MG chewable tablet   Cholecalciferol (VITAMIN D) 50 MCG (2000 UT) CAPS   Cyanocobalamin (B-12) 5000 MCG SUBL   hydroxypropyl methylcellulose / hypromellose (ISOPTO TEARS / GONIOVISC) 2.5 % ophthalmic solution   levothyroxine (SYNTHROID, LEVOTHROID) 112 MCG tablet   losartan-hydrochlorothiazide (HYZAAR) 50-12.5 MG tablet   metoprolol succinate (TOPROL-XL) 25 MG 24 hr tablet   Multiple Vitamins-Minerals (MULTIVITAMIN WITH MINERALS) tablet   NON FORMULARY   omeprazole (PRILOSEC) 40 MG capsule   rosuvastatin (CRESTOR) 20 MG tablet   Semaglutide 3 MG TABS   No current facility-administered medications for this encounter.    Myra Gianotti, PA-C Surgical Short Stay/Anesthesiology [facility] Phone [phone] [facility] Phone [phone] 07/26/2022 2:01 PM

## 2022-07-31 ENCOUNTER — Other Ambulatory Visit: Payer: Self-pay | Admitting: Obstetrics and Gynecology

## 2022-08-01 ENCOUNTER — Other Ambulatory Visit: Payer: Self-pay

## 2022-08-01 ENCOUNTER — Encounter (HOSPITAL_COMMUNITY)
Admission: RE | Disposition: A | Discharge status: Home / Self Care | Payer: Self-pay | Source: Home / Self Care | Attending: Obstetrics and Gynecology

## 2022-08-01 ENCOUNTER — Ambulatory Visit (HOSPITAL_COMMUNITY): Payer: Medicare Other | Admitting: Vascular Surgery

## 2022-08-01 ENCOUNTER — Encounter (HOSPITAL_COMMUNITY): Payer: Self-pay | Admitting: Obstetrics and Gynecology

## 2022-08-01 ENCOUNTER — Ambulatory Visit (HOSPITAL_COMMUNITY)
Admission: RE | Admit: 2022-08-01 | Discharge: 2022-08-01 | Disposition: A | Discharge status: Home / Self Care | Payer: Medicare Other | Attending: Obstetrics and Gynecology | Admitting: Obstetrics and Gynecology

## 2022-08-01 ENCOUNTER — Ambulatory Visit (HOSPITAL_BASED_OUTPATIENT_CLINIC_OR_DEPARTMENT_OTHER): Payer: Medicare Other | Admitting: Certified Registered Nurse Anesthetist

## 2022-08-01 ENCOUNTER — Ambulatory Visit (HOSPITAL_COMMUNITY): Payer: Medicare Other

## 2022-08-01 DIAGNOSIS — E039 Hypothyroidism, unspecified: Secondary | ICD-10-CM | POA: Insufficient documentation

## 2022-08-01 DIAGNOSIS — I272 Pulmonary hypertension, unspecified: Secondary | ICD-10-CM | POA: Insufficient documentation

## 2022-08-01 DIAGNOSIS — G4730 Sleep apnea, unspecified: Secondary | ICD-10-CM

## 2022-08-01 DIAGNOSIS — Z7982 Long term (current) use of aspirin: Secondary | ICD-10-CM | POA: Insufficient documentation

## 2022-08-01 DIAGNOSIS — I129 Hypertensive chronic kidney disease with stage 1 through stage 4 chronic kidney disease, or unspecified chronic kidney disease: Secondary | ICD-10-CM | POA: Insufficient documentation

## 2022-08-01 DIAGNOSIS — I119 Hypertensive heart disease without heart failure: Secondary | ICD-10-CM

## 2022-08-01 DIAGNOSIS — N840 Polyp of corpus uteri: Secondary | ICD-10-CM | POA: Diagnosis not present

## 2022-08-01 DIAGNOSIS — N189 Chronic kidney disease, unspecified: Secondary | ICD-10-CM | POA: Insufficient documentation

## 2022-08-01 DIAGNOSIS — Z87891 Personal history of nicotine dependence: Secondary | ICD-10-CM | POA: Insufficient documentation

## 2022-08-01 DIAGNOSIS — K219 Gastro-esophageal reflux disease without esophagitis: Secondary | ICD-10-CM | POA: Insufficient documentation

## 2022-08-01 DIAGNOSIS — Z01818 Encounter for other preprocedural examination: Secondary | ICD-10-CM

## 2022-08-01 DIAGNOSIS — N950 Postmenopausal bleeding: Secondary | ICD-10-CM | POA: Insufficient documentation

## 2022-08-01 DIAGNOSIS — I480 Paroxysmal atrial fibrillation: Secondary | ICD-10-CM | POA: Insufficient documentation

## 2022-08-01 DIAGNOSIS — E1122 Type 2 diabetes mellitus with diabetic chronic kidney disease: Secondary | ICD-10-CM | POA: Diagnosis not present

## 2022-08-01 HISTORY — PX: DILATATION & CURETTAGE/HYSTEROSCOPY WITH MYOSURE: SHX6511

## 2022-08-01 LAB — BASIC METABOLIC PANEL
Anion gap: 10 (ref 5–15)
BUN: 19 mg/dL (ref 8–23)
CO2: 23 mmol/L (ref 22–32)
Calcium: 9.5 mg/dL (ref 8.9–10.3)
Chloride: 104 mmol/L (ref 98–111)
Creatinine, Ser: 1.17 mg/dL — ABNORMAL HIGH (ref 0.44–1.00)
GFR, Estimated: 50 mL/min — ABNORMAL LOW (ref >60)
Glucose, Bld: 123 mg/dL — ABNORMAL HIGH (ref 70–99)
Potassium: 4 mmol/L (ref 3.5–5.1)
Sodium: 137 mmol/L (ref 135–145)

## 2022-08-01 LAB — GLUCOSE, CAPILLARY
Glucose-Capillary: 122 mg/dL — ABNORMAL HIGH (ref 70–99)
Glucose-Capillary: 104 mg/dL — ABNORMAL HIGH (ref 70–99)
Glucose-Capillary: 128 mg/dL — ABNORMAL HIGH (ref 70–99)

## 2022-08-01 LAB — ABO/RH: ABO/RH(D): O POS

## 2022-08-01 LAB — CBC
HCT: 39.6 % (ref 36.0–46.0)
Hemoglobin: 12.5 g/dL (ref 12.0–15.0)
MCH: 28.6 pg (ref 26.0–34.0)
MCHC: 31.6 g/dL (ref 30.0–36.0)
MCV: 90.6 fL (ref 80.0–100.0)
Platelets: 209 10*3/uL (ref 150–400)
RBC: 4.37 MIL/uL (ref 3.87–5.11)
RDW: 15.4 % (ref 11.5–15.5)
WBC: 9.6 10*3/uL (ref 4.0–10.5)
nRBC: 0 % (ref 0.0–0.2)

## 2022-08-01 LAB — TYPE AND SCREEN
ABO/RH(D): O POS
Antibody Screen: NEGATIVE

## 2022-08-01 SURGERY — DILATATION & CURETTAGE/HYSTEROSCOPY WITH MYOSURE
Anesthesia: General | Site: Uterus

## 2022-08-01 MED ORDER — CHLORHEXIDINE GLUCONATE 0.12 % MT SOLN
15.0000 mL | Freq: Once | OROMUCOSAL | Status: AC
  Administered 2022-08-01: 15 mL via OROMUCOSAL
  Filled 2022-08-01: qty 15

## 2022-08-01 MED ORDER — LIDOCAINE 2% (20 MG/ML) 5 ML SYRINGE
INTRAMUSCULAR | Status: AC
  Filled 2022-08-01: qty 5

## 2022-08-01 MED ORDER — SODIUM CHLORIDE 0.9 % IR SOLN
Status: DC | PRN
  Administered 2022-08-01: 1

## 2022-08-01 MED ORDER — MIDAZOLAM HCL 2 MG/2ML IJ SOLN
INTRAMUSCULAR | Status: DC | PRN
  Administered 2022-08-01: 2 mg via INTRAVENOUS

## 2022-08-01 MED ORDER — PHENYLEPHRINE 80 MCG/ML (10ML) SYRINGE FOR IV PUSH (FOR BLOOD PRESSURE SUPPORT)
PREFILLED_SYRINGE | INTRAVENOUS | Status: DC | PRN
  Administered 2022-08-01: 120 ug via INTRAVENOUS
  Administered 2022-08-01: 160 ug via INTRAVENOUS
  Administered 2022-08-01 (×2): 80 ug via INTRAVENOUS
  Administered 2022-08-01: 200 ug via INTRAVENOUS
  Administered 2022-08-01: 80 ug via INTRAVENOUS
  Administered 2022-08-01 (×2): 120 ug via INTRAVENOUS
  Administered 2022-08-01 (×4): 80 ug via INTRAVENOUS
  Administered 2022-08-01: 120 ug via INTRAVENOUS

## 2022-08-01 MED ORDER — BUPIVACAINE HCL (PF) 0.25 % IJ SOLN
INTRAMUSCULAR | Status: DC | PRN
  Administered 2022-08-01: 10 mL

## 2022-08-01 MED ORDER — LIDOCAINE 2% (20 MG/ML) 5 ML SYRINGE
INTRAMUSCULAR | Status: DC | PRN
  Administered 2022-08-01: 60 mg via INTRAVENOUS

## 2022-08-01 MED ORDER — ONDANSETRON HCL 4 MG/2ML IJ SOLN
INTRAMUSCULAR | Status: AC
  Filled 2022-08-01: qty 2

## 2022-08-01 MED ORDER — BUPIVACAINE HCL (PF) 0.25 % IJ SOLN
INTRAMUSCULAR | Status: AC
  Filled 2022-08-01: qty 10

## 2022-08-01 MED ORDER — FENTANYL CITRATE (PF) 250 MCG/5ML IJ SOLN
INTRAMUSCULAR | Status: AC
  Filled 2022-08-01: qty 5

## 2022-08-01 MED ORDER — OXYCODONE HCL 5 MG/5ML PO SOLN: 5.0000 mg | Freq: Once | ORAL | Status: DC | PRN

## 2022-08-01 MED ORDER — FENTANYL CITRATE (PF) 250 MCG/5ML IJ SOLN
INTRAMUSCULAR | Status: DC | PRN
  Administered 2022-08-01: 50 ug via INTRAVENOUS

## 2022-08-01 MED ORDER — PHENYLEPHRINE 80 MCG/ML (10ML) SYRINGE FOR IV PUSH (FOR BLOOD PRESSURE SUPPORT)
PREFILLED_SYRINGE | INTRAVENOUS | Status: AC
  Filled 2022-08-01: qty 20

## 2022-08-01 MED ORDER — LACTATED RINGERS IV SOLN: INTRAVENOUS | Status: DC

## 2022-08-01 MED ORDER — ONDANSETRON HCL 4 MG/2ML IJ SOLN
INTRAMUSCULAR | Status: DC | PRN
  Administered 2022-08-01: 4 mg via INTRAVENOUS

## 2022-08-01 MED ORDER — PROPOFOL 10 MG/ML IV BOLUS
INTRAVENOUS | Status: AC
  Filled 2022-08-01: qty 20

## 2022-08-01 MED ORDER — MIDAZOLAM HCL 2 MG/2ML IJ SOLN
INTRAMUSCULAR | Status: AC
  Filled 2022-08-01: qty 2

## 2022-08-01 MED ORDER — ORAL CARE MOUTH RINSE: 15.0000 mL | Freq: Once | OROMUCOSAL | Status: AC

## 2022-08-01 MED ORDER — INSULIN ASPART 100 UNIT/ML IJ SOLN: 0.0000 [IU] | INTRAMUSCULAR | Status: DC | PRN

## 2022-08-01 MED ORDER — FENTANYL CITRATE (PF) 100 MCG/2ML IJ SOLN: 25.0000 ug | INTRAMUSCULAR | Status: DC | PRN

## 2022-08-01 MED ORDER — PROPOFOL 10 MG/ML IV BOLUS
INTRAVENOUS | Status: DC | PRN
  Administered 2022-08-01: 170 mg via INTRAVENOUS

## 2022-08-01 MED ORDER — OXYCODONE HCL 5 MG PO TABS: 5.0000 mg | ORAL_TABLET | Freq: Once | ORAL | Status: DC | PRN

## 2022-08-01 MED ORDER — ONDANSETRON HCL 4 MG/2ML IJ SOLN: 4.0000 mg | Freq: Four times a day (QID) | INTRAMUSCULAR | Status: DC | PRN

## 2022-08-01 MED ORDER — POVIDONE-IODINE 10 % EX SWAB: 2.0000 | Freq: Once | CUTANEOUS | Status: DC

## 2022-08-01 SURGICAL SUPPLY — 20 items
CANISTER SUCT 3000ML PPV (MISCELLANEOUS) ×1 IMPLANT
CATH ROBINSON RED A/P 16FR (CATHETERS) ×1 IMPLANT
DEVICE MYOSURE LITE (MISCELLANEOUS) IMPLANT
DEVICE MYOSURE REACH (MISCELLANEOUS) IMPLANT
DILATOR CANAL MILEX (MISCELLANEOUS) IMPLANT
GLOVE BIOGEL PI IND STRL 6.5 (GLOVE) ×1 IMPLANT
GLOVE BIOGEL PI IND STRL 7.0 (GLOVE) IMPLANT
GLOVE SURG LTX SZ6.5 (GLOVE) ×1 IMPLANT
GLOVE SURG SS PI 6.5 STRL IVOR (GLOVE) IMPLANT
GLOVE SURG SS PI 7.0 STRL IVOR (GLOVE) IMPLANT
GLOVE SURG UNDER POLY LF SZ7 (GLOVE) ×1 IMPLANT
GOWN STRL REUS W/ TWL LRG LVL3 (GOWN DISPOSABLE) ×2 IMPLANT
GOWN STRL REUS W/TWL LRG LVL3 (GOWN DISPOSABLE) ×2
KIT PROCEDURE FLUENT (KITS) ×1 IMPLANT
KIT TURNOVER KIT B (KITS) ×1 IMPLANT
PACK VAGINAL MINOR WOMEN LF (CUSTOM PROCEDURE TRAY) ×1 IMPLANT
PAD OB MATERNITY 4.3X12.25 (PERSONAL CARE ITEMS) ×1 IMPLANT
SEAL ROD LENS SCOPE MYOSURE (ABLATOR) ×1 IMPLANT
TOWEL GREEN STERILE FF (TOWEL DISPOSABLE) ×2 IMPLANT
UNDERPAD 30X36 HEAVY ABSORB (UNDERPADS AND DIAPERS) ×1 IMPLANT

## 2022-08-01 NOTE — Anesthesia Procedure Notes (Signed)
Procedure Name: LMA Insertion Date/Time: 08/01/2022 11:49 AM  Performed by: Shary Decamp, CRNAPre-anesthesia Checklist: Patient identified, Patient being monitored, Timeout performed, Emergency Drugs available and Suction available Patient Re-evaluated:Patient Re-evaluated prior to induction Oxygen Delivery Method: Circle System Utilized Preoxygenation: Pre-oxygenation with 100% oxygen Induction Type: IV induction Ventilation: Mask ventilation without difficulty LMA: LMA inserted LMA Size: 4.0 Number of attempts: 1 Placement Confirmation: positive ETCO2 and breath sounds checked- equal and bilateral Tube secured with: Tape Dental Injury: Teeth and Oropharynx as per pre-operative assessment

## 2022-08-01 NOTE — Op Note (Signed)
Preoperative diagnosis: PMB, endometrial polyp  Postop diagnosis: as above.  Procedure: Hysteroscopic polypectomy, D&C with myosure Anesthesia General via LMA  Surgeon: Rhoderick Moody, MD  Assistant:none IV fluids : Estimated blood loss: 62ml Urine output: straight catheter preop  59ml Complications none  Condition stable  Disposition PACU  Specimen: endometrial polyp with endometrial curettings  Findings: smooth, normal appearing endometrium with small polyp; synechiae  Procedure  Indication: postmenopausal bleeding. Patient was counseled on risks/ complications including infection, bleeding, damage to internal organs, she understood and agrees, gave informed written consent.  Patient was brought to the operating room with IV running. Time out was carried out. She underwent general anesthesia via LMA without complications. She placed in the dorsolithotomy position. The patient was prepped and draped in standard fashion. The bladder was catheterized once. Bimanual exam revealed uterus to be anteverted and normal size. Speculum was placed and cervix was grasped with single-tooth tenaculum. Cervical block with 20 cc 0.25% marcaine given. The uterus was sounded to 8 cm. Cervical os was dilated to 17 [place] dilator. Hysteroscope was introduced in the uterine cavity under vision. Findings: the endometrial cavity was thin, smooth, and with a small polyp, also notied was uterine synechiae at lus. Hysteroscopic polypectomy was performed with myosure and endometrial sampling. Specimen sent to path. Hysteroscope was removed.   Fluid deficit 420 cc.  All counts are correct x2. No complications. Patient was made supine dorsal anesthesia and brought to the recovery room in stable condition.  Patient will be discharged home today. Discharge meds tylenol. Follow up in 2 weeks in office. Warning signs of infection and excessive bleeding reviewed.   I was present for the entire surgery.  When the  patient's right leg was being removed from the stirrup, there was an audible pop. The leg had been lifted from the foot/ankle and no support was noted at the knee.  I was not present in room but this was relayed to me by OR staff.  Ordering an xray of knee now.

## 2022-08-01 NOTE — Transfer of Care (Signed)
Immediate Anesthesia Transfer of Care Note  Patient: Amber Costa  Procedure(s) Performed: DILATATION & CURETTAGE/HYSTEROSCOPY WITH MYOSURE (Uterus)  Patient Location: PACU  Anesthesia Type:General  Level of Consciousness: awake, alert , patient cooperative, and responds to stimulation  Airway & Oxygen Therapy: Patient Spontanous Breathing and Patient connected to nasal cannula oxygen  Post-op Assessment: Report given to RN, Post -op Vital signs reviewed and stable, and Patient moving all extremities X 4  Post vital signs: Reviewed and stable  Last Vitals:  Vitals Value Taken Time  BP 118/68 08/01/22 1245  Temp    Pulse 83 08/01/22 1247  Resp 19 08/01/22 1247  SpO2 96 % 08/01/22 1247  Vitals shown include unvalidated device data.  Last Pain:  Vitals:   08/01/22 0907  TempSrc: Oral  PainSc: 0-No pain         Complications: No notable events documented.

## 2022-08-01 NOTE — H&P (Signed)
Amber Costa is an 71 y.o. female G1P1 with h/o PMB here for D&C, h/s with polypectomy.  She initially was found to have a 2x1cm endometrial polyp 2/23 but was lost to f/u for a short time then had medical complications postponing this procedure.  She had a repeat SIS 07/09/22 which showed: an endometrial polyp about 2.8x1.2cm, ?second polyp 1.9cm; ant wall 2.36mm, posterior wall 3.46mm.  An endometrial bx was performed and noted negative endocervical sampling but no endometrial results.  She denies vaginal bleeding for few months now. She has had cardiac clearance and also medical clearance from her PCP. She stopped her baby asa 5 days ago.  Pertinent Gynecological History: See above  Menstrual History:  No LMP recorded. Patient is postmenopausal.    Past Medical History:  Diagnosis Date   Anemia    Dry eye syndrome of bilateral lacrimal glands    GERD (gastroesophageal reflux disease)    Heart murmur    History of hiatal hernia    Hypercholesterolemia    Hyperlipidemia    Hypertension    Hypothyroidism    PAF (paroxysmal atrial fibrillation) (HCC)    Posterior vitreous detachment of right eye    Pulmonary hypertension (HCC)    Sleep apnea    Type 2 diabetes mellitus with chronic kidney disease, without long-term current use of insulin (HCC)     Past Surgical History:  Procedure Laterality Date   BREAST REDUCTION SURGERY Bilateral    BRONCHOSCOPY  2023   CARDIAC CATHETERIZATION  2017   CATARACT EXTRACTION Bilateral 2023   CHOLECYSTECTOMY     EYE SURGERY     lasik   NEPHRECTOMY Right 2011   TONSILLECTOMY      History reviewed. No pertinent family history.  Social History:  reports that she quit smoking about 32 years ago. Her smoking use included cigarettes. She has a 0.75 pack-year smoking history. She has never used smokeless tobacco. She reports current alcohol use. She reports that she does not use drugs.  Allergies:  Allergies  Allergen Reactions   Penicillins  Other (See Comments)    Patient reports that by Allergy Testing, She has a POSITIVE REACTION to PCN Allergen By allergy testing + allergie to penicillin Patient reports that by Allergy Testing, She has a POSITIVE REACTION to PCN Allergen    Shellfish Allergy Nausea And Vomiting and Rash    Medications Prior to Admission  Medication Sig Dispense Refill Last Dose   aspirin 81 MG chewable tablet Chew 81 mg by mouth daily.   07/26/2022   Cholecalciferol (VITAMIN D) 50 MCG (2000 UT) CAPS Take 2,000 Units by mouth daily.   Past Week   Cyanocobalamin (B-12) 5000 MCG SUBL Place 5,000 mcg under the tongue daily.   Past Week   hydroxypropyl methylcellulose / hypromellose (ISOPTO TEARS / GONIOVISC) 2.5 % ophthalmic solution Place 1 drop into both eyes as needed for dry eyes.   08/01/2022   levothyroxine (SYNTHROID, LEVOTHROID) 112 MCG tablet Take 112 mcg by mouth daily before breakfast.   08/01/2022 at 0730   losartan-hydrochlorothiazide (HYZAAR) 50-12.5 MG tablet Take 1 tablet by mouth daily.   07/31/2022   metoprolol succinate (TOPROL-XL) 25 MG 24 hr tablet Take 25 mg by mouth daily.   08/01/2022 at 0800   Multiple Vitamins-Minerals (MULTIVITAMIN WITH MINERALS) tablet Take 1 tablet by mouth daily.   Past Week   NON FORMULARY Pt uses a cpap nightly      omeprazole (PRILOSEC) 40 MG capsule Take 40 mg by  mouth daily as needed (acid reflux).   Past Week   rosuvastatin (CRESTOR) 20 MG tablet Take 20 mg by mouth at bedtime.   07/31/2022   Semaglutide 3 MG TABS Take 3 mg by mouth daily.   07/31/2022 at 1000   albuterol (PROVENTIL HFA) 108 (90 Base) MCG/ACT inhaler Inhale 2 puffs into the lungs every 4 (four) hours as needed for wheezing or shortness of breath.   More than a month    ROS SOB/chest pain/ HA/ vision changes/ LE pain or swelling/ etc.  Blood pressure (!) 148/79, pulse 91, temperature 98.1 F (36.7 C), temperature source Oral, resp. rate 18, height 5\' 1"  (1.549 m), weight 79.4 kg, SpO2 97  %. Physical Exam A&O x 3 HEENT grossly wnl Lungs ctab CV rrr Abdo : soft,nd,nt Extr no edema, nt  Pelvic: deferred   Results for orders placed or performed during the hospital encounter of 08/01/22 (from the past 24 hour(s))  Glucose, capillary     Status: Abnormal   Collection Time: 08/01/22  9:08 AM  Result Value Ref Range   Glucose-Capillary 122 (H) 70 - 99 mg/dL  Type and screen     Status: None (Preliminary result)   Collection Time: 08/01/22  9:35 AM  Result Value Ref Range   ABO/RH(D) PENDING    Antibody Screen PENDING    Sample Expiration      08/04/2022,2359 Performed at [facility], [place] [address] [place], [place] [address]   ABO/Rh     Status: None   Collection Time: 08/01/22  9:35 AM  Result Value Ref Range   ABO/RH(D)      O POS Performed at [facility] [address], [place], [place] [id number]   Basic metabolic panel     Status: Abnormal   Collection Time: 08/01/22  9:42 AM  Result Value Ref Range   Sodium 137 135 - 145 mmol/L   Potassium 4.0 3.5 - 5.1 mmol/L   Chloride 104 98 - 111 mmol/L   CO2 23 22 - 32 mmol/L   Glucose, Bld 123 (H) 70 - 99 mg/dL   BUN 19 8 - 23 mg/dL   Creatinine, Ser 1.17 (H) 0.44 - 1.00 mg/dL   Calcium 9.5 8.9 - 10.3 mg/dL   GFR, Estimated 50 (L) >60 mL/min   Anion gap 10 5 - 15  CBC     Status: None   Collection Time: 08/01/22  9:42 AM  Result Value Ref Range   WBC 9.6 4.0 - 10.5 K/uL   RBC 4.37 3.87 - 5.11 MIL/uL   Hemoglobin 12.5 12.0 - 15.0 g/dL   HCT 39.6 36.0 - 46.0 %   MCV 90.6 80.0 - 100.0 fL   MCH 28.6 26.0 - 34.0 pg   MCHC 31.6 30.0 - 36.0 g/dL   RDW 15.4 11.5 - 15.5 %   Platelets 209 150 - 400 K/uL   nRBC 0.0 0.0 - 0.2 %    No results found.  Assessment/Plan: 71y/o G1P1 with pmb, endometrial polyp I have reviewed the procedure and associated risks including bleeding, infection, risk of injury to bowel, bladder, nerves, blood vessels, risk of further surgery, risk of blood clots to  legs/lungs, risk of further surgery, risk of anesthesia.  Consent signed. Proceed to or. CKD - creatine stable today, slightly elevated at 1.17 Pulm hypertension - cleared by cardiology  Charyl Bigger 08/01/2022, 10:50 AM.

## 2022-08-02 ENCOUNTER — Encounter (HOSPITAL_COMMUNITY): Payer: Self-pay | Admitting: Obstetrics and Gynecology

## 2022-08-02 NOTE — Anesthesia Postprocedure Evaluation (Signed)
Anesthesia Post Note  Patient: Amber Costa  Procedure(s) Performed: DILATATION & CURETTAGE/HYSTEROSCOPY WITH MYOSURE (Uterus)     Patient location during evaluation: PACU Anesthesia Type: General Level of consciousness: awake and alert Pain management: pain level controlled Vital Signs Assessment: post-procedure vital signs reviewed and stable Respiratory status: spontaneous breathing, nonlabored ventilation, respiratory function stable and patient connected to nasal cannula oxygen Cardiovascular status: blood pressure returned to baseline and stable Postop Assessment: no apparent nausea or vomiting Anesthetic complications: no   No notable events documented.  Last Vitals:  Vitals:   08/01/22 1345 08/01/22 1355  BP: (!) 100/57 114/63  Pulse: 74 75  Resp: 15 (!) 25  Temp:  36.7 C  SpO2: 99% 97%    Last Pain:  Vitals:   08/01/22 1355  TempSrc:   PainSc: 0-No pain                 Callista Hoh S

## 2022-08-05 LAB — SURGICAL PATHOLOGY
